# Patient Record
Sex: Female | Born: 1980 | Hispanic: Yes | Marital: Married | Smoking: Never smoker
Health system: Southern US, Community
[De-identification: ages and names within clinical notes are randomized; demographics above are authoritative.]

## PROBLEM LIST (undated history)

## (undated) DIAGNOSIS — E785 Hyperlipidemia, unspecified: Secondary | ICD-10-CM

## (undated) HISTORY — DX: Hyperlipidemia, unspecified: E78.5

---

## 2008-07-10 ENCOUNTER — Ambulatory Visit: Payer: Self-pay

## 2009-11-25 ENCOUNTER — Ambulatory Visit: Payer: Self-pay | Admitting: Family Medicine

## 2010-05-19 ENCOUNTER — Ambulatory Visit: Payer: Self-pay | Admitting: Family Medicine

## 2010-05-19 ENCOUNTER — Inpatient Hospital Stay: Payer: Self-pay | Admitting: Obstetrics and Gynecology

## 2012-02-17 ENCOUNTER — Ambulatory Visit: Payer: Self-pay | Admitting: Family Medicine

## 2012-07-14 ENCOUNTER — Inpatient Hospital Stay: Payer: Self-pay | Admitting: Obstetrics and Gynecology

## 2012-07-14 LAB — CBC WITH DIFFERENTIAL/PLATELET
Basophil #: 0 10*3/uL (ref 0.0–0.1)
Basophil %: 0.3 %
Eosinophil #: 0.2 10*3/uL (ref 0.0–0.7)
Eosinophil %: 2.3 %
HGB: 11.5 g/dL — ABNORMAL LOW (ref 12.0–16.0)
Lymphocyte #: 1.3 10*3/uL (ref 1.0–3.6)
MCH: 28.5 pg (ref 26.0–34.0)
MCHC: 34 g/dL (ref 32.0–36.0)
MCV: 84 fL (ref 80–100)
Monocyte #: 0.4 x10 3/mm (ref 0.2–0.9)
Monocyte %: 6 %
Neutrophil #: 5.5 10*3/uL (ref 1.4–6.5)
Platelet: 164 10*3/uL (ref 150–440)
WBC: 7.5 10*3/uL (ref 3.6–11.0)

## 2014-02-07 ENCOUNTER — Ambulatory Visit: Payer: Self-pay

## 2014-03-19 IMAGING — US US OB US >=[ID] SNGL FETUS
1 series · 13 of 28 positions shown · non-contrast
Comparison: none

REASON FOR EXAM: anatomy eval cervical length
COMMENTS:

[Series 1: us ob us >=(id) sngl fetus · 0.26mm/px · 13 of 107 slices shown]
[im 4/107]
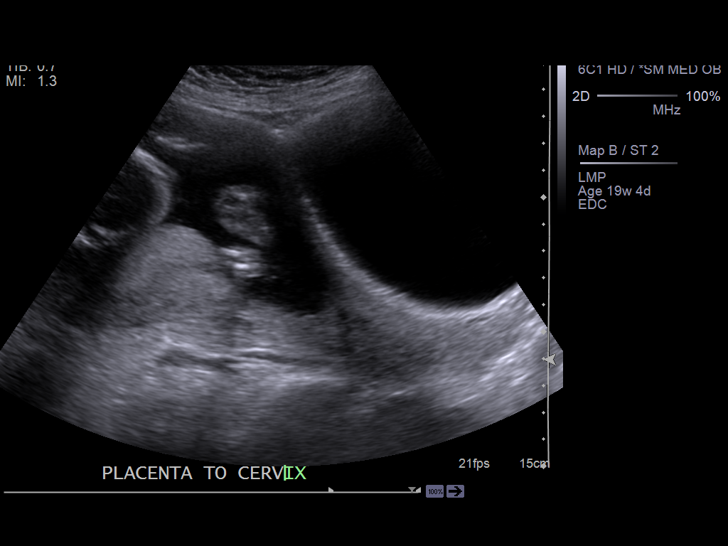
[im 12/107]
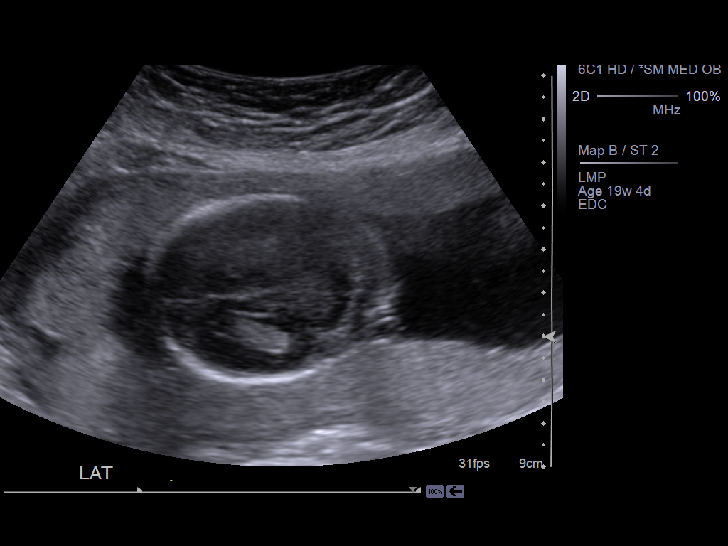
[im 20/107]
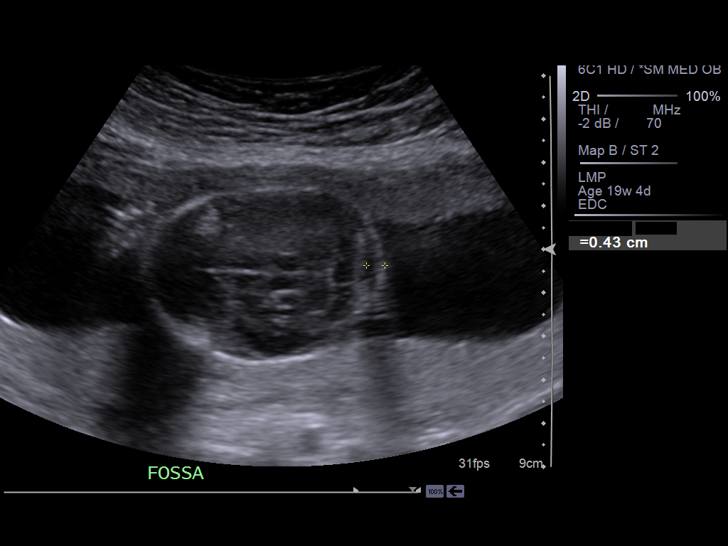
[im 28/107]
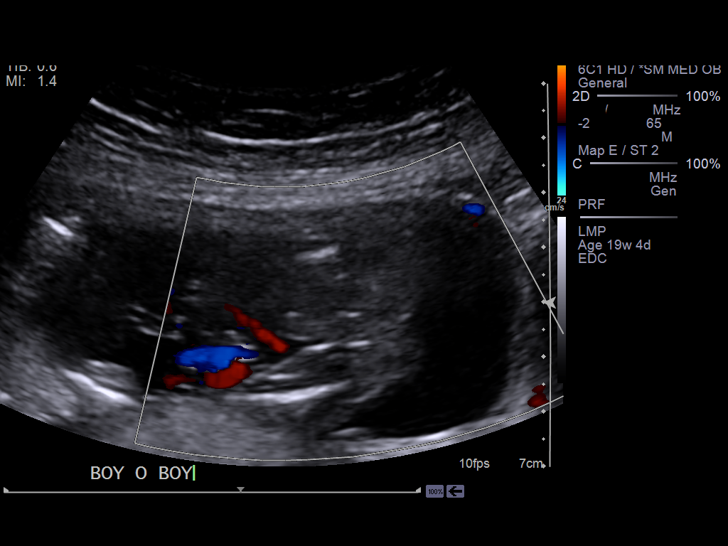
[im 36/107]
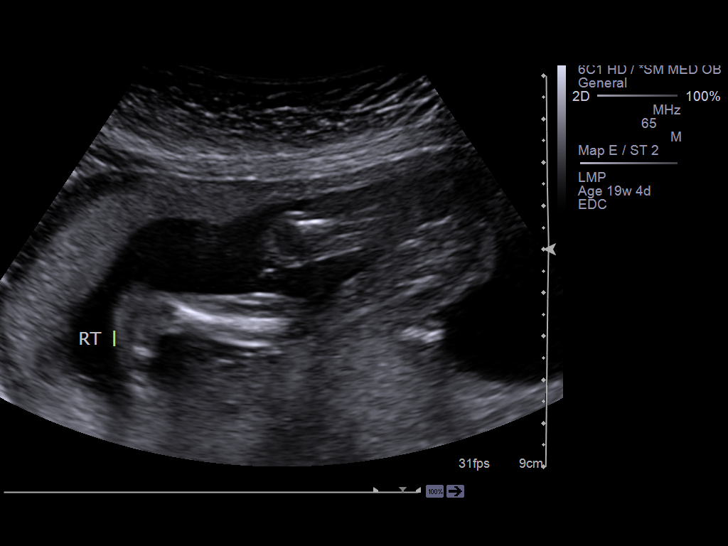
[im 44/107]
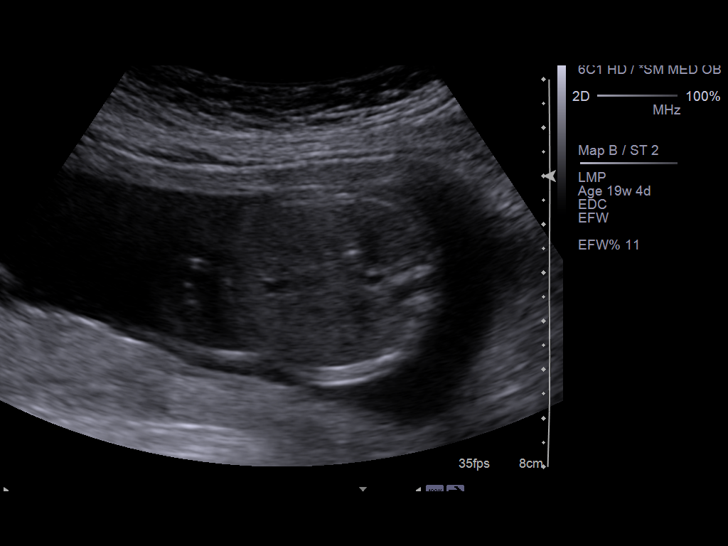
[im 55/107]
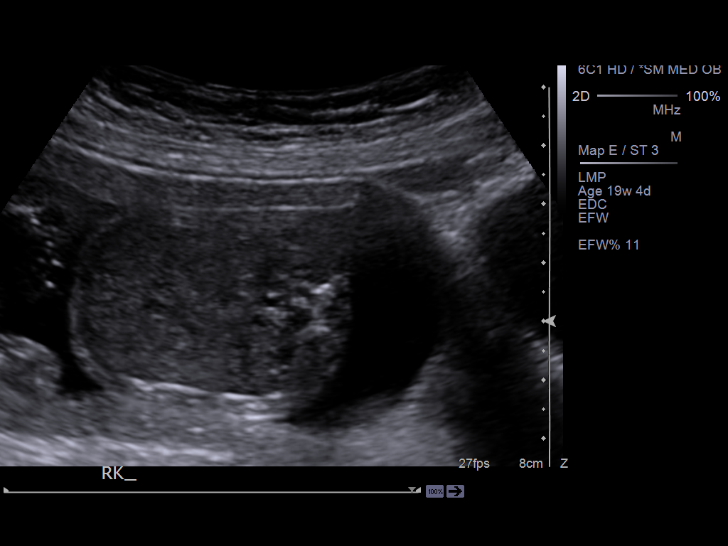
[im 63/107]
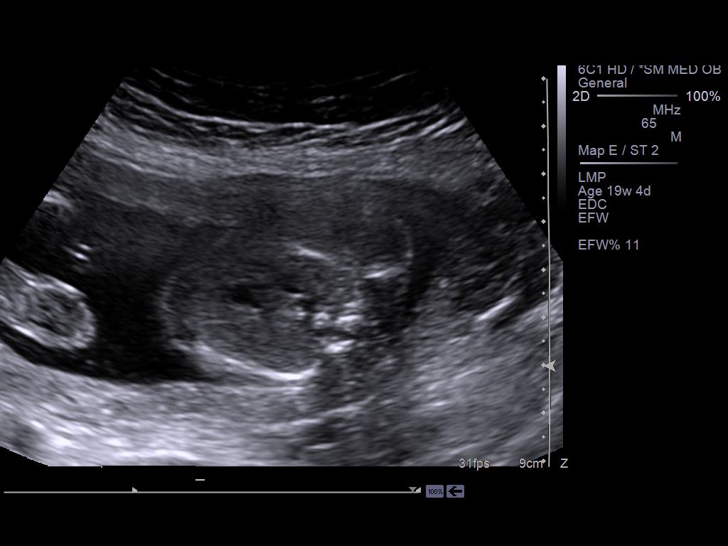
[im 71/107]
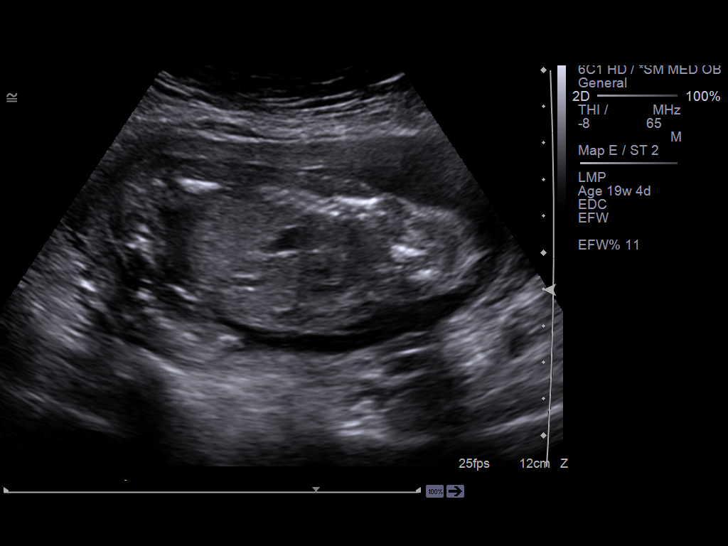
[im 79/107]
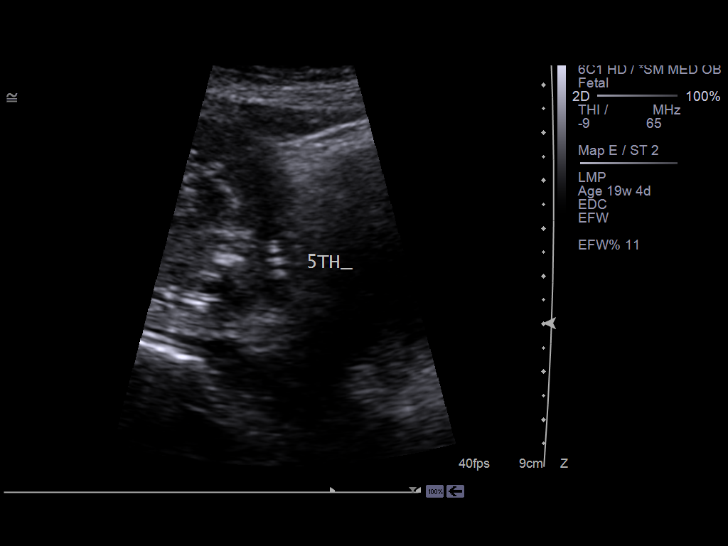
[im 87/107]
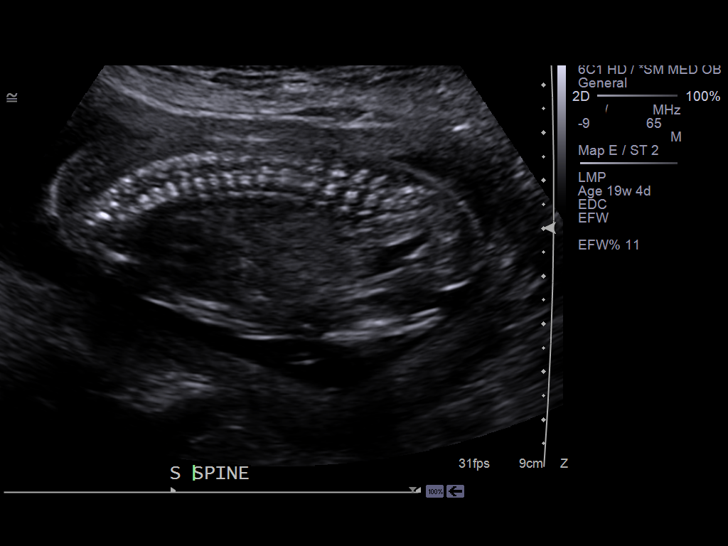
[im 95/107]
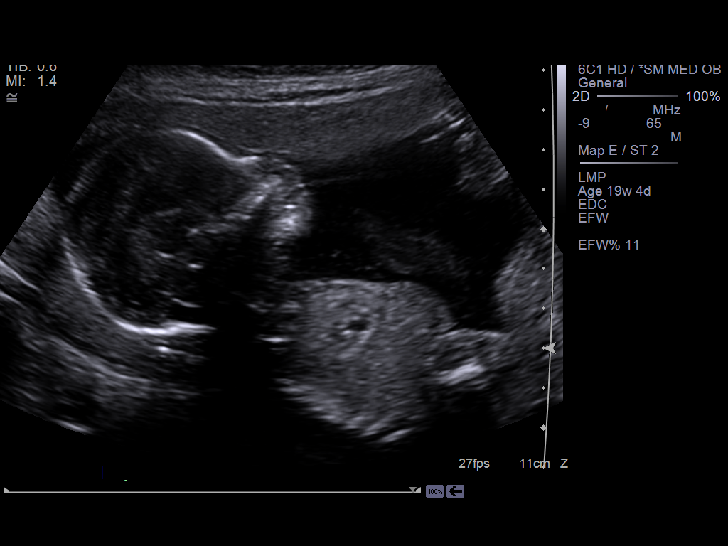
[im 103/107]
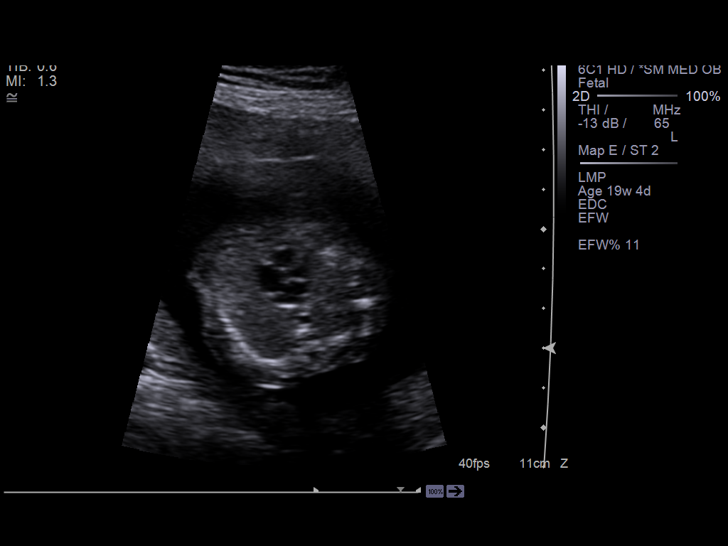

[13 of 28 positions shown; findings below may reference images not displayed]

PROCEDURE:     US  - US OB GREATER/OR EQUAL TO S99FH  - February 17, 2012  [DATE]

RESULT:     There is a gravid uterus present. The fetal presentation is
breech. The amniotic fluid index is 10.7 which is within the limits of
normal. The placenta is posterior, partially fundal, and predominantly
right-sided. The distance from the lower placental segment to the internal
cervical os is 3.42 cm. The cervical length is 5.1 cm.

The intracranial structures, the craniocervical junction, and the spinal
structures are grossly normal. A 4 chambered heart with a rate of 160 beats
per minute was demonstrated. The fetal stomach, kidneys, and urinary bladder
normal in appearance.

Measured parameters:
BPD 4.37 cm corresponding to 19 weeks and day gestation
HC 16.03 cm corresponding to an EGA of 18 weeks 6 days
AC 13.1 cm corresponding due to EGA of 18 weeks 4 days
FL 20.9 cm corresponding to an EGA of 18 weeks 6 days
HL 2.72 cm corresponding to an EGA of 18 weeks 5 days
The estimated fetal weight is 258 grams + / - 38 grams.
IMPRESSION: 1. There is a single viable intrauterine gestation with breech presentation.
The estimated gestational age is 18 weeks 6 days plus or minus approximately
12 days. The estimated date of confinement is July 14, 2012.
2. The cervical length is 5.1 cm.

[REDACTED]

## 2014-05-25 NOTE — Op Note (Signed)
PATIENT NAME:  Lindsay Webb, Lindsay Webb MR#:  [id number] DATE OF BIRTH:  [birth year]  DATE OF PROCEDURE:  07/14/2012  PREOPERATIVE DIAGNOSES:  1.  At 40 plus 6 weeks estimated gestational age.  2.  Breech presentation.   POSTOPERATIVE DIAGNOSES: 1.  At 40 plus 6 weeks estimated gestational age.  2.  Breech presentation.   PROCEDURES:  1.  Primary low transverse cesarean section.  2.  Homero FellersFrank breech extraction.  3.  ON-Q pump placement.   ANESTHESIA:  Spinal.   SURGEON:  Suzy Bouchardhomas J. Larue Drawdy, M.D.   FIRST ASSISTANT:  Yetta BarreJones, nurse midwife.   INDICATIONS:  A 34 year old gravida 4, para 3, patient at 40 plus 6 weeks, who was admitted for induction upon rupturing of the membranes. The patient was noted to be in the breech presentation.   PROCEDURE:  After adequate spinal anesthesia, the patient was placed in the dorsal supine position, hip roll under the right side. The patient had previously received 2 grams of IV Ancef for prophylaxis. The patient's abdomen was prepped and draped in normal sterile fashion. A Pfannenstiel incision was made 2 fingerbreadths above the symphysis pubis. Sharp dissection was used to identify the fascia, and the fascia was opened in the midline and opened in a transverse fashion. The superior aspect of the fascia was grasped with Kocher clamps, and the recti muscles dissected free. Entry into the peritoneal cavity was accomplished sharply. The vesicouterine peritoneal fold was identified, and a bladder flap was created, and the bladder was reflected inferiorly. A low transverse uterine incision was made. Upon entry into the endometrial cavity, clear fluid resulted. Fetal buttocks was noted and delivered through the incision, followed by a sweeping motion of the legs, and each arm was delivered without difficulty. A Mauriceau-Smellie-Veit maneuver was performed to deliver the fetal head, a vigorous female. Cord was doubly clamped, and the infant was passed to Dr. Martyn MalayAthavale,  who assigned Apgar scores of 9 and 9. Fetal weight 3350 grams, 7 pounds 6 ounces. Placenta was manually delivered, and the uterus was exteriorized. The endometrial cavity was wiped clean with laparotomy tape, and the uterine incision was closed with 1 chromic suture in a running locking fashion with good approximation of edges. Good hemostasis was noted. Two separate figure-of-eight sutures were required for good hemostasis. Fallopian tubes and ovaries appeared normal. The posterior cul-de-sac was irrigated and suctioned, and the uterus was placed back into the abdominal cavity. The paracolic gutters were wiped clean bilaterally. The uterine incision again appeared hemostatic. Interceed was then placed on the uterine incision in a T-shaped fashion. The ON-Q pump catheters were then advanced from an infraumbilical incision to a subfascial position. The fascia was then closed over top of these catheters with 0 Vicryl suture in a running nonlocking fashion with good approximation of edges. Subcutaneous tissues were irrigated and bovied, and the skin was reapproximated with staples. The ON-Q pump catheters were secured at the skin level with Dermabond and Steri-Strip'd and Tegaderm'd to the skin, and each catheter was loaded with 5 mL of 0.5% Marcaine. There were no complications.   ESTIMATED BLOOD LOSS:  500 mL.  INTRAOPERATIVE FLUIDS:  1000 mL.   DISPOSITION:  The patient was taken to the recovery room in good condition.     ____________________________ Suzy Bouchardhomas J. Colbie Danner, MD tjs:dmm D: 07/14/2012 11:45:20 ET T: 07/14/2012 11:56:12 ET JOB#: [id number]  cc: Suzy Bouchardhomas J. Janda Cargo, MD, <Dictator> Suzy BouchardHOMAS J Juliette Standre MD ELECTRONICALLY SIGNED 07/15/2012 21:07

## 2014-06-12 NOTE — H&P (Signed)
L&D Evaluation:  History:  HPI 34yo [id number] with LMP of 10/02/11 & EDC by 07/08/12 [facility] 18.6 [facility], GBS neg, allergic rhinitis, anemia, hyperlipidemia, here for scheduled IOL for post-dates. No ROM,VB or decreased FM. No UC pattern today. Awaiting interpreter for risks, benefits and alternatives including infection, bleeding, uterine or fetal intolerance and poss of C-section.   Presents with post-dates   Patient's Medical History Anemia, Allergic rhinitis, hyperlipidemia,   Patient's Surgical History none   Medications Pre Natal Vitamins   Allergies NKDA   Social History none   Family History Non-Contributory   ROS:  ROS All systems were reviewed.  HEENT, CNS, GI, GU, Respiratory, CV, Renal and Musculoskeletal systems were found to be normal.   Exam:  Vital Signs stable   General no apparent distress   Mental Status clear   Chest clear   Heart normal sinus rhythm, no murmur/gallop/rubs   Abdomen gravid, non-tender   Estimated Fetal Weight Average for gestational age   Back no CVAT   Mebranes Intact   FHT normal rate with no decels   Ucx absent   Skin dry   Lymph no lymphadenopathy   Plan:  Plan monitor contractions and for cervical change, IOL   Comments Risks, benefits and alternatives disc with pt and she accepts risks and agrees with use of Pitocin, AROM and IV's. Pt has not requested epidural at this point.   Electronic Signatures: Sharee PimpleJones, Caron W (CNM)  (Signed 12-Jun-14 08:20)  Authored: L&D Evaluation   Last Updated: 12-Jun-14 08:20 by Sharee PimpleJones, Caron W (CNM)

## 2016-03-09 IMAGING — US US BREAST*L* LIMITED INC AXILLA
1 series · 2 of 2 positions shown · non-contrast
Comparison: This is the patient's baseline exam

CLINICAL DATA: Questioned palpable finding per patient left breast
three o'clock location

EXAM:
DIGITAL DIAGNOSTIC  bilateral MAMMOGRAM WITH CAD
ULTRASOUND left BREAST

[Series 1: us breast*left* limited inc axilla · 0.08mm/px · 2 of 2 slices shown]
[im 1/2]
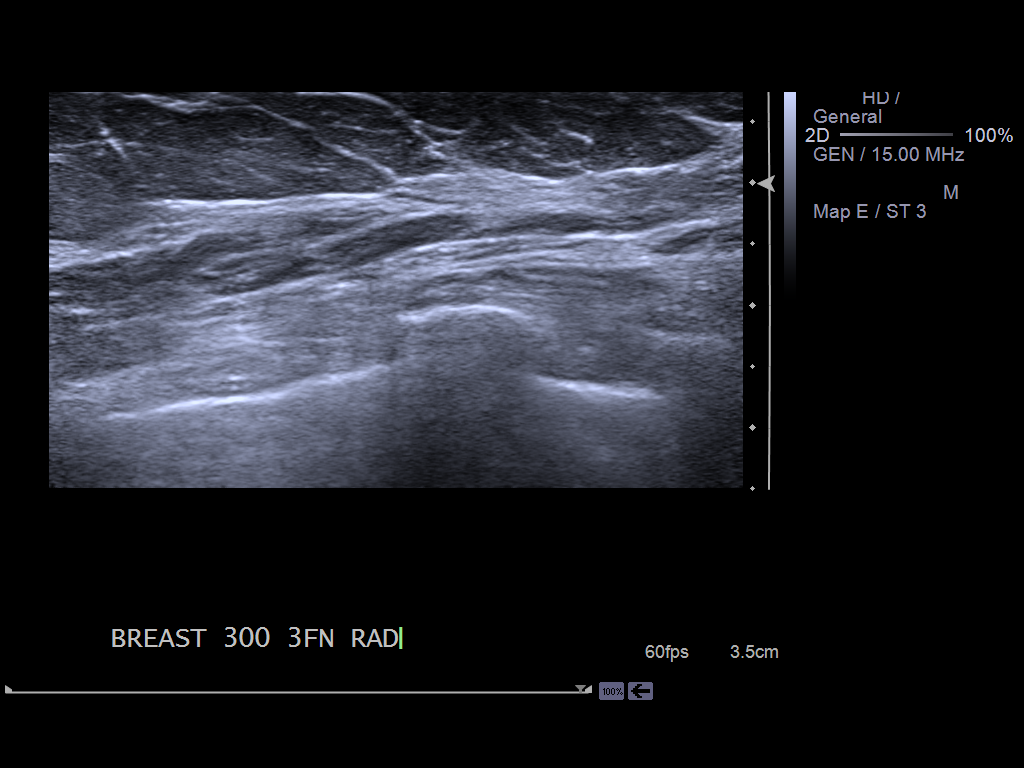
[im 2/2]
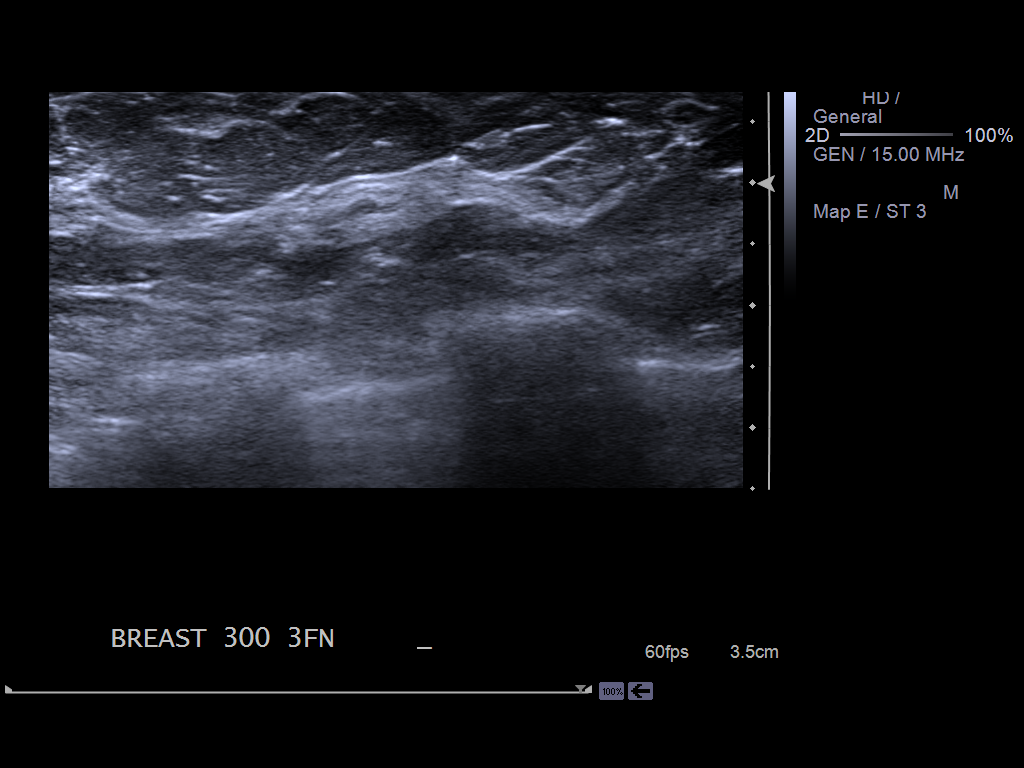

[2 of 2 positions shown; findings below may reference images not displayed]

ACR Breast Density Category c: The breast tissue is heterogeneously
dense, which may obscure small masses.
FINDINGS: No mammographic abnormality is identified in either breast.

Mammographic images were processed with CAD.

On physical exam, I palpate no abnormality in the left breast 3
o'clock location.

Ultrasound is performed, showing no sonographic abnormality in the
left breast 3 o'clock location at the site of the questioned
palpable finding.
IMPRESSION: No evidence for malignancy. Any further management of the questioned
palpable finding should be dictated by clinical assessment. An
interpreter was present for the examination and discussion of
findings and recommendations.

RECOMMENDATION:
Screening mammogram at age 40 unless there are persistent or
intervening clinical concerns. (Code:[id number])

I have discussed the findings and recommendations with the patient.
Results were also provided in writing at the conclusion of the
visit. If applicable, a reminder letter will be sent to the patient
regarding the next appointment.

BI-RADS CATEGORY  1: Negative.

## 2019-05-04 ENCOUNTER — Ambulatory Visit: Payer: Self-pay | Attending: Internal Medicine

## 2019-05-04 DIAGNOSIS — Z23 Encounter for immunization: Secondary | ICD-10-CM

## 2019-05-04 NOTE — Progress Notes (Signed)
   Covid-19 Vaccination Clinic  Name:  Lindsay Webb    MRN: [id number] DOB: 1980-02-05  05/04/2019  [name] was observed post Covid-19 immunization for 15 minutes without incident. She was provided with Vaccine Information Sheet and instruction to access the V-Safe system.   [name] was instructed to call 911 with any severe reactions post vaccine: Marland Kitchen Difficulty breathing  . Swelling of face and throat  . A fast heartbeat  . A bad rash all over body  . Dizziness and weakness   Immunizations Administered    Name Date Dose VIS Date Route   Pfizer COVID-19 Vaccine 05/04/2019  5:01 PM 0.3 mL 01/13/2019 Intramuscular   Manufacturer: [employer], [employer]   Lot: [id number]   NDC: [address]-9

## 2019-05-26 ENCOUNTER — Ambulatory Visit: Payer: Self-pay | Attending: Internal Medicine

## 2019-05-26 ENCOUNTER — Other Ambulatory Visit: Payer: Self-pay

## 2019-05-26 DIAGNOSIS — Z23 Encounter for immunization: Secondary | ICD-10-CM

## 2019-05-26 NOTE — Progress Notes (Signed)
   Covid-19 Vaccination Clinic  Name:  Falicity Sheets    MRN: [id number] DOB: [birth year]  05/26/2019  Ms. Debhora Titus was observed post Covid-19 immunization for 15 minutes without incident. She was provided with Vaccine Information Sheet and instruction to access the V-Safe system.   Ms. Ileana Chalupa was instructed to call 911 with any severe reactions post vaccine: Marland Kitchen Difficulty breathing  . Swelling of face and throat  . A fast heartbeat  . A bad rash all over body  . Dizziness and weakness   Immunizations Administered    Name Date Dose VIS Date Route   Pfizer COVID-19 Vaccine 05/26/2019  4:33 PM 0.3 mL 03/29/2018 Intramuscular   Manufacturer: [employer], [employer]   Lot: [id number]   NDC: [address]-8

## 2020-02-12 ENCOUNTER — Other Ambulatory Visit: Payer: Self-pay

## 2020-02-12 DIAGNOSIS — Z20822 Contact with and (suspected) exposure to covid-19: Secondary | ICD-10-CM

## 2020-02-13 LAB — NOVEL CORONAVIRUS, NAA: SARS-CoV-2, NAA: DETECTED — AB

## 2020-02-13 LAB — SARS-COV-2, NAA 2 DAY TAT

## 2021-11-26 ENCOUNTER — Other Ambulatory Visit: Payer: Self-pay

## 2021-11-26 DIAGNOSIS — Z1231 Encounter for screening mammogram for malignant neoplasm of breast: Secondary | ICD-10-CM

## 2021-12-01 ENCOUNTER — Ambulatory Visit: Payer: Self-pay | Attending: Hematology and Oncology | Admitting: Hematology and Oncology

## 2021-12-01 ENCOUNTER — Ambulatory Visit
Admission: RE | Admit: 2021-12-01 | Discharge: 2021-12-01 | Disposition: A | Discharge status: Home / Self Care | Payer: Self-pay | Source: Ambulatory Visit | Attending: Obstetrics and Gynecology | Admitting: Obstetrics and Gynecology

## 2021-12-01 VITALS — BP 120/69 | Wt 118.1 lb

## 2021-12-01 DIAGNOSIS — Z1231 Encounter for screening mammogram for malignant neoplasm of breast: Secondary | ICD-10-CM | POA: Insufficient documentation

## 2021-12-01 NOTE — Patient Instructions (Signed)
Taught Lindsay Webb about breast self awareness. Patient did not need a Pap smear today due to last Pap smear was in 2022 per patient.  Let her know [facility] will cover Pap smears every 5 years unless has a history of abnormal Pap smears. [facility]. Appointment scheduled for 12/01/21. Patient aware of appointment and will be there. Let patient know will follow up with her within the next couple weeks with results. Lindsay Webb verbalized understanding. She will return to clinic in one year for repeat mammogram and 4 years for repeat Pap smear.   Lindsay Ped, NP 2:04 PM

## 2021-12-01 NOTE — Progress Notes (Signed)
[name] is Webb 41 y.o. female who presents to [facility] clinic today with no complaints.    Pap Smear: Pap not smear completed today. Last Pap smear was 2022 at [facility] and was normal. Per patient has no history of an abnormal Pap smear. Last Pap smear result is not available in Epic.   Physical exam: Breasts Breasts symmetrical. No skin abnormalities bilateral breasts. No nipple retraction bilateral breasts. No nipple discharge bilateral breasts. No lymphadenopathy. No lumps palpated bilateral breasts.        Pelvic/Bimanual Pap is not indicated today    Smoking History: Patient has never smoked and was not referred to quit line.    Patient Navigation: Patient education provided. Access to services provided for patient through [facility] Wilhemena Durie [phone]) interpreter provided. No transportation provided   Colorectal Cancer Screening: Per patient has never had colonoscopy completed No complaints today.    Breast and Cervical Cancer Risk Assessment: Patient does not have family history of breast cancer, known genetic mutations, or radiation treatment to the chest before age 50. Patient does not have history of cervical dysplasia, immunocompromised, or DES exposure in-utero.  Risk Assessment     Risk Scores       12/01/2021   Last edited by: Lindsay Revel, LPN   5-year risk: 0.4 %   Lifetime risk: 5.5 %            Webb: [facility] exam without pap smear No complaints with benign exam.   P: [facility] to [facility] for [facility] screening mammogram. Appointment scheduled 12/01/21.  Lindsay Scrape A, NP 12/01/2021 2:01 PM

## 2022-11-09 ENCOUNTER — Telehealth: Payer: Self-pay | Admitting: Hematology and Oncology
